# Patient Record
Sex: Female | Born: 1940 | Race: Black or African American | Hispanic: No
Health system: Southern US, Community
[De-identification: ages and names within clinical notes are randomized; demographics above are authoritative.]

---

## 2009-04-26 ENCOUNTER — Encounter: Admission: RE | Admit: 2009-04-26 | Discharge: 2009-04-26 | Payer: Self-pay | Admitting: Family Medicine

## 2009-09-28 ENCOUNTER — Encounter: Admission: RE | Admit: 2009-09-28 | Discharge: 2009-09-28 | Payer: Self-pay | Admitting: Family Medicine

## 2010-09-05 ENCOUNTER — Encounter: Payer: Self-pay | Admitting: Family Medicine

## 2020-02-20 ENCOUNTER — Non-Acute Institutional Stay: Payer: Medicare Other | Admitting: Hospice

## 2020-02-20 ENCOUNTER — Other Ambulatory Visit: Payer: Self-pay

## 2020-02-20 NOTE — Progress Notes (Signed)
    Therapist, nutritional Palliative Care Consult Note Telephone: [phone]  Fax: [phone]  PATIENT NAME: Bethany Zuniga DOB: 02/10/1941 MRN: [id number]  THIS VISIT DID NOT HOLD. CHART OPENED IN ERROR

## 2020-11-04 ENCOUNTER — Other Ambulatory Visit: Payer: Self-pay

## 2020-11-04 ENCOUNTER — Ambulatory Visit
Admission: EM | Admit: 2020-11-04 | Discharge: 2020-11-04 | Disposition: A | Discharge status: Home / Self Care | Payer: Medicare PPO

## 2020-11-04 ENCOUNTER — Encounter: Payer: Self-pay | Admitting: Emergency Medicine

## 2020-11-04 DIAGNOSIS — J069 Acute upper respiratory infection, unspecified: Secondary | ICD-10-CM

## 2020-11-04 MED ORDER — BENZONATATE 200 MG PO CAPS: 200.0000 mg | ORAL_CAPSULE | Freq: Three times a day (TID) | ORAL | 0 refills | Status: AC | PRN

## 2020-11-04 MED ORDER — LORATADINE 10 MG PO TABS: 10.0000 mg | ORAL_TABLET | Freq: Every day | ORAL | 0 refills | Status: AC

## 2020-11-04 MED ORDER — FLUTICASONE PROPIONATE 50 MCG/ACT NA SUSP: 1.0000 | Freq: Every day | NASAL | 0 refills | Status: AC

## 2020-11-04 NOTE — ED Provider Notes (Signed)
[facility]    CSN: [id number] Arrival date & time: 11/04/20  1314      History   Chief Complaint Chief Complaint  Patient presents with  . Cough  . Otalgia    HPI Bethany Zuniga is a 80 y.o. female presenting today for evaluation of congestion and sore throat.  Reports over the past 2 to 3 days she has had increased congestion and throat irritation/drainage.  Reports slight cough.  Denies any fevers.  Also reports associated pressure on ears.  Denies fevers.  HPI  History reviewed. No pertinent past medical history.  There are no problems to display for this patient.   History reviewed. No pertinent surgical history.  OB History   No obstetric history on file.      Home Medications    Prior to Admission medications   Medication Sig Start Date End Date Taking? Authorizing Provider  benzonatate (TESSALON) 200 MG capsule Take 1 capsule (200 mg total) by mouth 3 (three) times daily as needed for up to 7 days for cough. 11/04/20 11/11/20 Yes Braelyn Jenson C, PA-C  fluticasone (FLONASE) 50 MCG/ACT nasal spray Place 1-2 sprays into both nostrils daily. 11/04/20  Yes Macy Lingenfelter C, PA-C  loratadine (CLARITIN) 10 MG tablet Take 1 tablet (10 mg total) by mouth daily. 11/04/20  Yes Rajohn Henery C, PA-C  thyroid (ARMOUR) 60 MG tablet Take 1 tablet by mouth daily. 10/04/20 10/04/21 Yes [provider]  Cholecalciferol 125 MCG (5000 UT) TABS Take by mouth.    [provider]  diltiazem (CARDIZEM CD) 240 MG 24 hr capsule Take by mouth daily. 09/20/20   [provider]  lisinopril (ZESTRIL) 20 MG tablet Take 20 mg by mouth daily. 08/05/20   [provider]  magnesium oxide (MAG-OX) 400 MG tablet Take by mouth.    [provider]    Family History History reviewed. No pertinent family history.  Social History     Allergies   Dilantin [phenytoin], Percocet [oxycodone-acetaminophen], and Quinolones   Review of  Systems Review of Systems  Constitutional: Negative for activity change, appetite change, chills, fatigue and fever.  HENT: Positive for congestion, ear pain, rhinorrhea, sinus pressure and sore throat. Negative for trouble swallowing.   Eyes: Negative for discharge and redness.  Respiratory: Positive for cough. Negative for chest tightness and shortness of breath.   Cardiovascular: Negative for chest pain.  Gastrointestinal: Negative for abdominal pain, diarrhea, nausea and vomiting.  Musculoskeletal: Negative for myalgias.  Skin: Negative for rash.  Neurological: Negative for dizziness, light-headedness and headaches.     Physical Exam Triage Vital Signs ED Triage Vitals  Enc Vitals Group     BP 11/04/20 1404 (!) 142/77     Pulse Rate 11/04/20 1404 73     Resp 11/04/20 1404 20     Temp 11/04/20 1404 97.8 F (36.6 C)     Temp src --      SpO2 11/04/20 1404 96 %     Weight --      Height --      Head Circumference --      Peak Flow --      Pain Score 11/04/20 1402 5     Pain Loc --      Pain Edu? --      Excl. in GC? --    No data found.  Updated Vital Signs BP (!) 142/77   Pulse 73   Temp 97.8 F (36.6 C)   Resp  20   SpO2 96%   Visual Acuity Right Eye Distance:   Left Eye Distance:   Bilateral Distance:    Right Eye Near:   Left Eye Near:    Bilateral Near:     Physical Exam Vitals and nursing note reviewed.  Constitutional:      Appearance: She is well-developed.     Comments: No acute distress  HENT:     Head: Normocephalic and atraumatic.     Ears:     Comments: Bilateral ears without tenderness to palpation of external auricle, tragus and mastoid, EAC's without erythema or swelling, TM's with good bony landmarks and cone of light. Non erythematous.  Small clear effusions bilaterally    Nose: Nose normal.     Mouth/Throat:     Comments: Oral mucosa pink and moist, no tonsillar enlargement or exudate. Posterior pharynx patent and nonerythematous,  no uvula deviation or swelling. Normal phonation. Eyes:     Conjunctiva/sclera: Conjunctivae normal.  Cardiovascular:     Rate and Rhythm: Normal rate.  Pulmonary:     Effort: Pulmonary effort is normal. No respiratory distress.     Comments: Breathing comfortably at rest, CTABL, no wheezing, rales or other adventitious sounds auscultated Abdominal:     General: There is no distension.  Musculoskeletal:        General: Normal range of motion.     Cervical back: Neck supple.  Skin:    General: Skin is warm and dry.  Neurological:     Mental Status: She is alert and oriented to person, place, and time.      UC Treatments / Results  Labs (all labs ordered are listed, but only abnormal results are displayed) Labs Reviewed  NOVEL CORONAVIRUS, NAA    EKG   Radiology No results found.  Procedures Procedures (including critical care time)  Medications Ordered in UC Medications - No data to display  Initial Impression / Assessment and Plan / UC Course  I have reviewed the triage vital signs and the nursing notes.  Pertinent labs & imaging results that were available during my care of the patient were reviewed by me and considered in my medical decision making (see chart for details).     Suspect viral URI with cough versus allergic rhinitis-exam reassuring, lungs clear to auscultation, vital signs stable, suspect viral etiology and recommending symptomatic and supportive care.  Rest and fluids.  Screening for Covid.  Recommendations provided.  Continue to monitor,Discussed strict return precautions. Patient verbalized understanding and is agreeable with plan.  Final Clinical Impressions(s) / UC Diagnoses   Final diagnoses:  Viral URI with cough     Discharge Instructions     Covid test pending, monitor MyChart for results Begin daily Claritin/loratadine-you may get this over-the-counter if cheaper Flonase 1 to 2 spray in each nostril daily Tessalon/benzonatate  every 8 hours for cough-or may use over-the-counter Robitussin, Delsym    ED Prescriptions    Medication Sig Dispense Auth. Provider   loratadine (CLARITIN) 10 MG tablet Take 1 tablet (10 mg total) by mouth daily. 10 tablet Ladell Bey C, PA-C   benzonatate (TESSALON) 200 MG capsule Take 1 capsule (200 mg total) by mouth 3 (three) times daily as needed for up to 7 days for cough. 28 capsule Garret Teale C, PA-C   fluticasone (FLONASE) 50 MCG/ACT nasal spray Place 1-2 sprays into both nostrils daily. 16 g Akiyah Eppolito, [place] C, PA-C     PDMP not reviewed this encounter.   Arminda Foglio, [employer]  C, PA-C 11/04/20 1546

## 2020-11-04 NOTE — ED Triage Notes (Signed)
Pt presents with c/o scratchy throat and cough for past few days, also has c/o left ear pressure

## 2020-11-04 NOTE — Discharge Instructions (Addendum)
Covid test pending, monitor MyChart for results Begin daily Claritin/loratadine-you may get this over-the-counter if cheaper Flonase 1 to 2 spray in each nostril daily Tessalon/benzonatate every 8 hours for cough-or may use over-the-counter Robitussin, Delsym

## 2020-11-05 LAB — NOVEL CORONAVIRUS, NAA: SARS-CoV-2, NAA: NOT DETECTED

## 2020-11-05 LAB — SARS-COV-2, NAA 2 DAY TAT

## 2021-01-11 ENCOUNTER — Other Ambulatory Visit: Payer: Self-pay | Admitting: Student

## 2021-01-13 ENCOUNTER — Other Ambulatory Visit: Payer: Self-pay | Admitting: Student

## 2021-01-13 DIAGNOSIS — I701 Atherosclerosis of renal artery: Secondary | ICD-10-CM

## 2021-02-16 ENCOUNTER — Other Ambulatory Visit: Payer: Self-pay | Admitting: Student

## 2021-02-16 DIAGNOSIS — I701 Atherosclerosis of renal artery: Secondary | ICD-10-CM

## 2021-02-16 DIAGNOSIS — I10 Essential (primary) hypertension: Secondary | ICD-10-CM

## 2021-02-17 ENCOUNTER — Ambulatory Visit
Admission: RE | Admit: 2021-02-17 | Discharge: 2021-02-17 | Disposition: A | Discharge status: Home / Self Care | Payer: Medicare PPO | Source: Ambulatory Visit | Attending: Student | Admitting: Student

## 2021-02-17 DIAGNOSIS — I10 Essential (primary) hypertension: Secondary | ICD-10-CM

## 2021-02-17 DIAGNOSIS — I701 Atherosclerosis of renal artery: Secondary | ICD-10-CM | POA: Insufficient documentation

## 2022-01-11 IMAGING — US US RENAL ARTERY STENOSIS
1 series · 14 of 25 positions shown · non-contrast
Comparison: CT abdomen 09/28/2009

CLINICAL DATA: 79-year-old female with a history of hypertension

EXAM:
RENAL/URINARY TRACT ULTRASOUND
RENAL DUPLEX DOPPLER ULTRASOUND

[Series 1: us renal artery duplex complete · 14 of 77 slices shown]
[im 1/77]
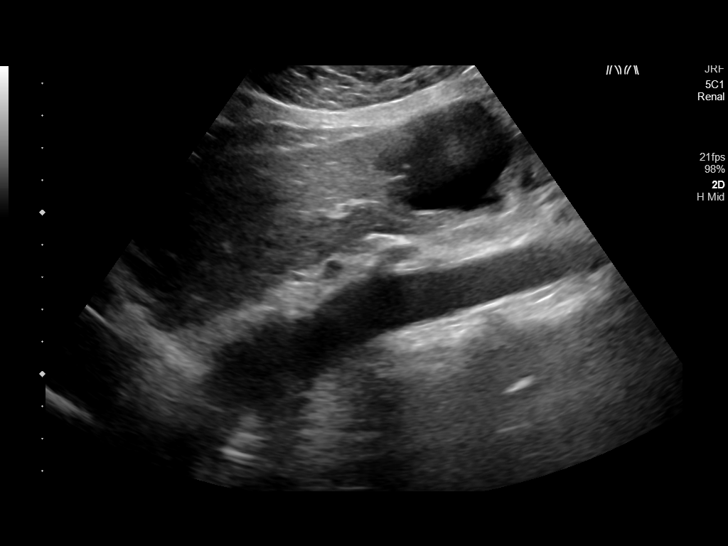
[im 7/77]
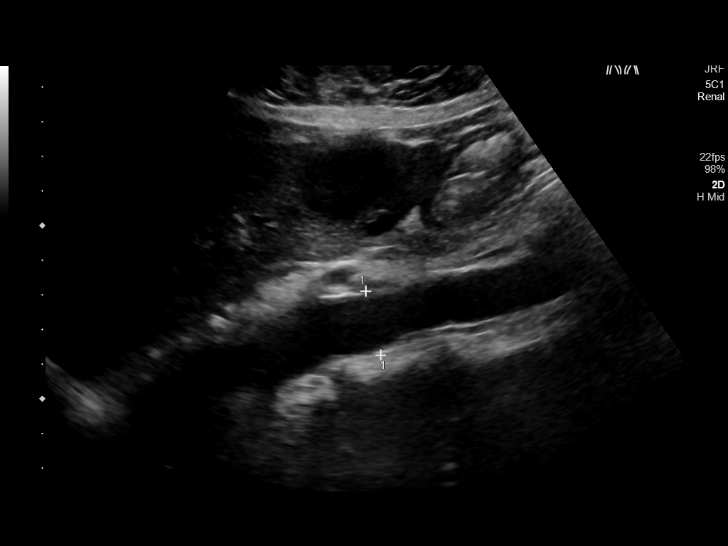
[im 13/77]
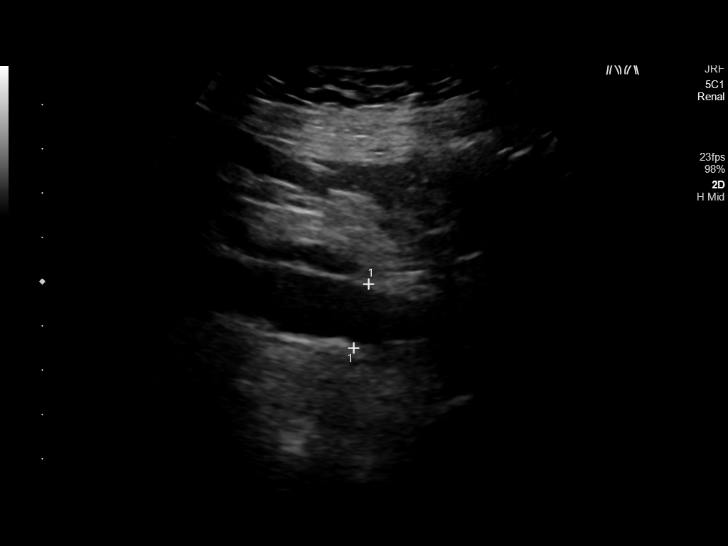
[im 20/77]
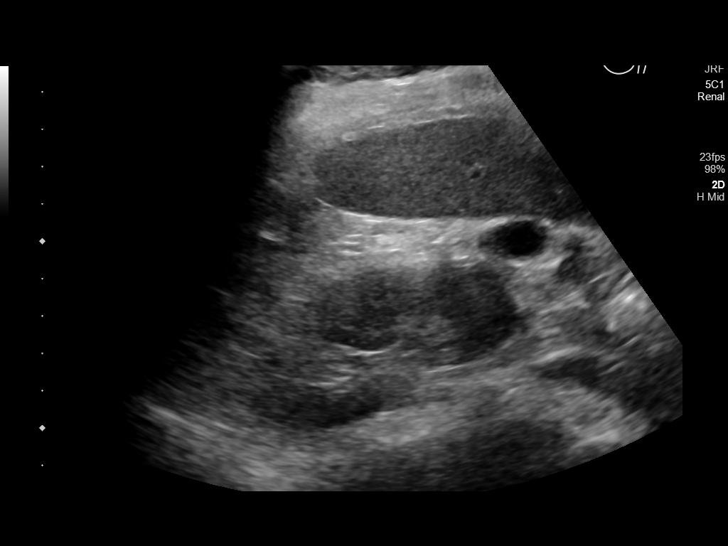
[im 26/77]
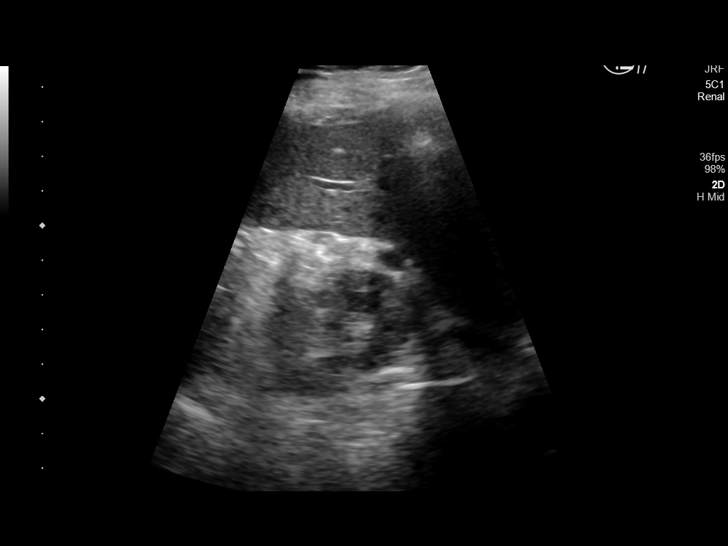
[im 29/77]
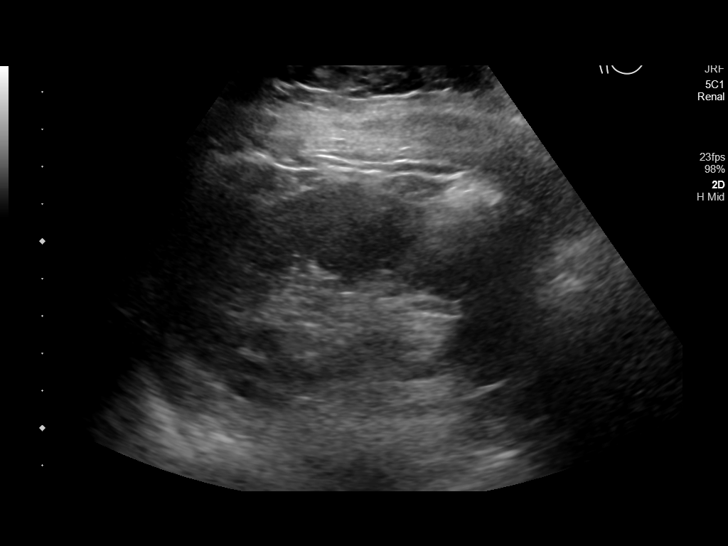
[im 35/77]
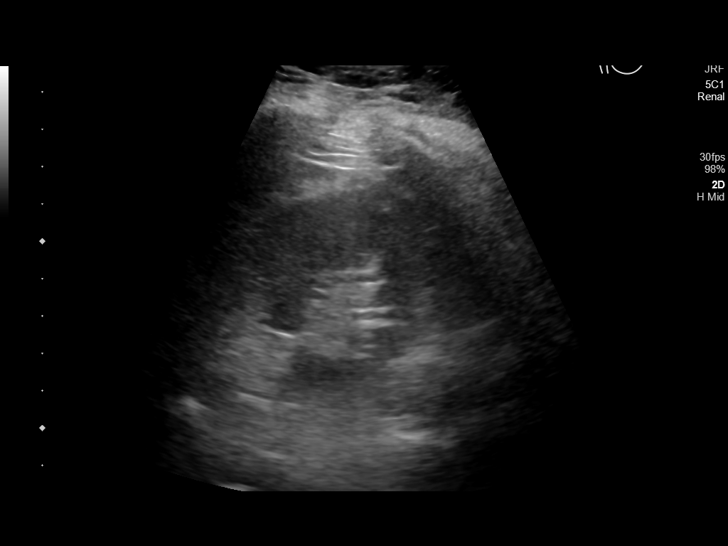
[im 42/77]
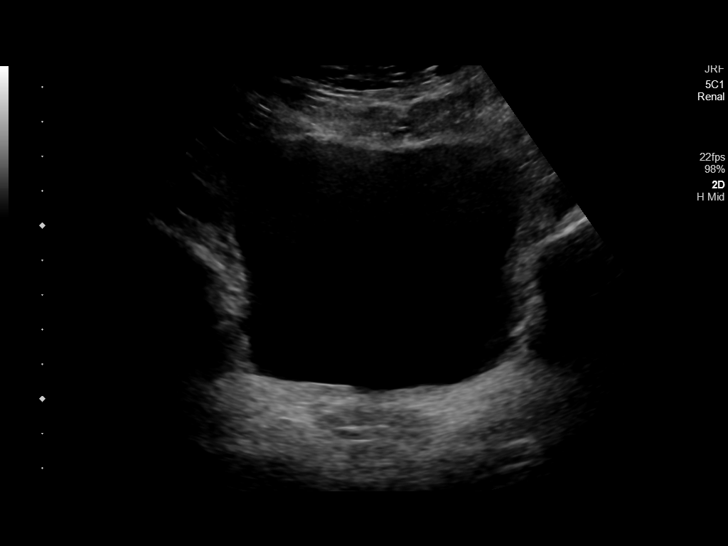
[im 48/77]
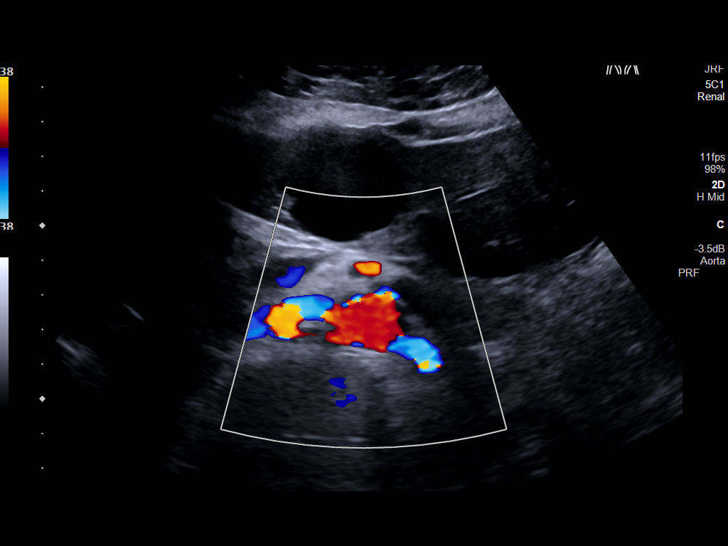
[im 51/77]
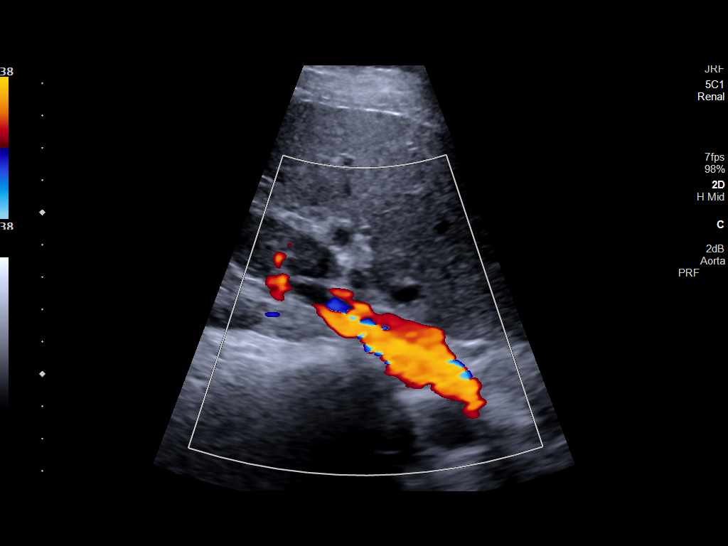
[im 58/77]
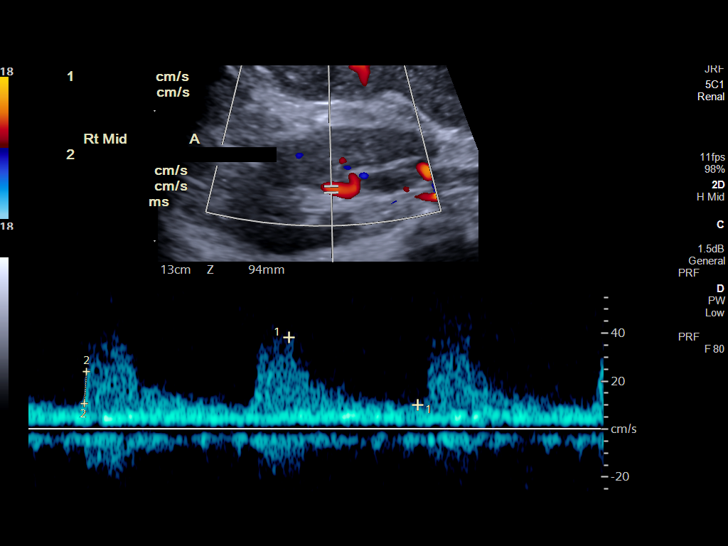
[im 64/77]
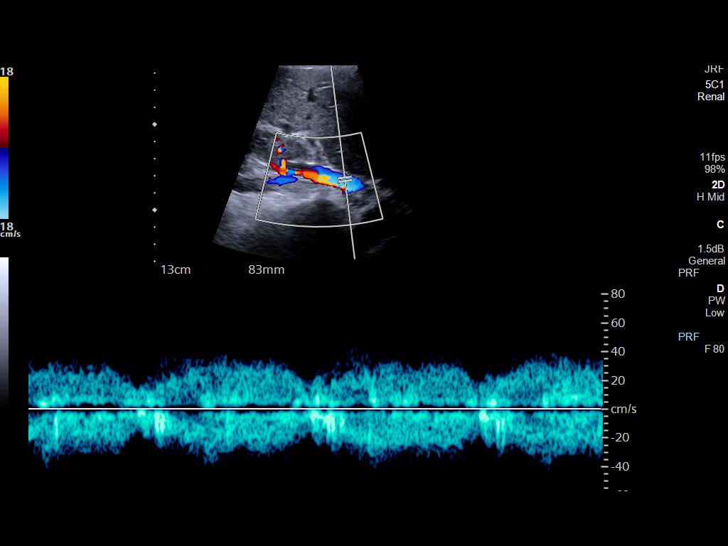
[im 70/77]
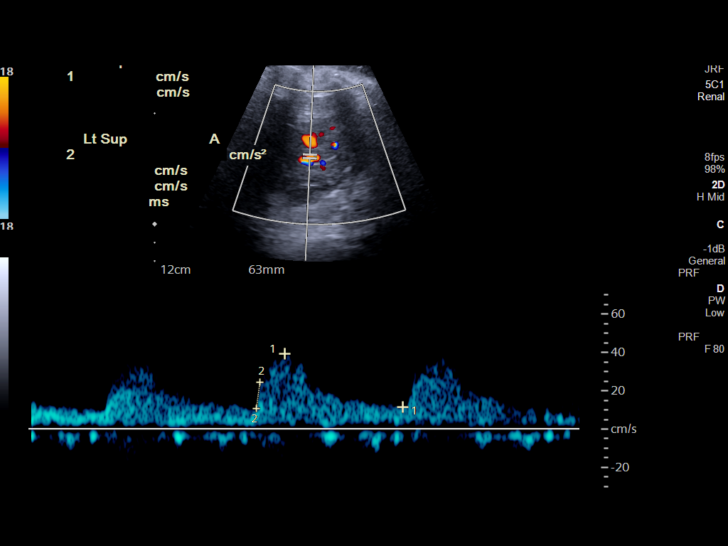
[im 77/77]
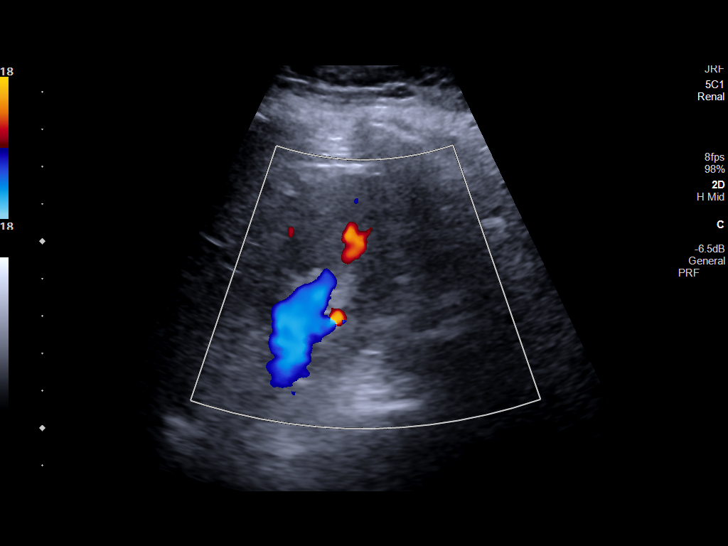

[14 of 25 positions shown; findings below may reference images not displayed]

FINDINGS: Right Kidney:

Length: 8.8 cm.  Echogenicity unremarkable.  No hydronephrosis.

Left Kidney:

Length: 9.1 cm.  Echogenicity unremarkable.  No hydronephrosis.

Bladder:  Unremarkable

RENAL DUPLEX ULTRASOUND

Right Renal Artery Velocities:

Origin:  96 cm/sec

Mid:  121 cm/sec

Hilum:  101 cm/sec

Interlobar:  38 cm/sec

Arcuate:  37 cm/sec

Left Renal Artery Velocities:

Origin:  102 cm/sec

Mid:  168 cm/sec

Hilum:  105 cm/sec

Interlobar:  39 cm/sec

Arcuate:  27 cm/sec

Aortic Velocity:  73 cm/sec

Right Renal-Aortic Ratios:

Origin:

Mid:

Hilum:

Interlobar:

Arcuate:

Left Renal-Aortic Ratios:

Origin:

Mid:

Hilum:

Interlobar:

Arcuate:

Additional: Cystic lesion of the liver incidentally imaged, present
on comparison CT scan.
IMPRESSION: Directed duplex of the renal arteries demonstrates no evidence of
high-grade stenosis.

## 2022-06-02 ENCOUNTER — Other Ambulatory Visit: Payer: Self-pay | Admitting: Internal Medicine

## 2022-06-02 DIAGNOSIS — E782 Mixed hyperlipidemia: Secondary | ICD-10-CM

## 2022-06-14 ENCOUNTER — Other Ambulatory Visit: Payer: Medicare PPO

## 2022-06-28 ENCOUNTER — Other Ambulatory Visit: Payer: Medicare PPO

## 2022-06-29 ENCOUNTER — Ambulatory Visit
Admission: RE | Admit: 2022-06-29 | Discharge: 2022-06-29 | Disposition: A | Discharge status: Home / Self Care | Payer: Medicare PPO | Source: Ambulatory Visit | Attending: Internal Medicine | Admitting: Internal Medicine

## 2022-06-29 DIAGNOSIS — E782 Mixed hyperlipidemia: Secondary | ICD-10-CM

## 2024-06-27 ENCOUNTER — Other Ambulatory Visit: Payer: Self-pay | Admitting: Pulmonary Disease

## 2024-06-27 DIAGNOSIS — R058 Other specified cough: Secondary | ICD-10-CM

## 2024-06-27 DIAGNOSIS — J029 Acute pharyngitis, unspecified: Secondary | ICD-10-CM

## 2024-07-25 ENCOUNTER — Ambulatory Visit

## 2024-07-25 ENCOUNTER — Ambulatory Visit
Admission: RE | Admit: 2024-07-25 | Discharge: 2024-07-25 | Disposition: A | Source: Ambulatory Visit | Attending: Pulmonary Disease

## 2024-07-25 DIAGNOSIS — R058 Other specified cough: Secondary | ICD-10-CM

## 2024-07-25 DIAGNOSIS — J029 Acute pharyngitis, unspecified: Secondary | ICD-10-CM
